# Patient Record
Sex: Female | Born: 1950 | Race: Black or African American | Hispanic: No | Marital: Married | Smoking: Current every day smoker
Health system: Southern US, Community
[De-identification: ages and names within clinical notes are randomized; demographics above are authoritative.]

## PROBLEM LIST (undated history)

## (undated) DIAGNOSIS — E789 Disorder of lipoprotein metabolism, unspecified: Secondary | ICD-10-CM

## (undated) DIAGNOSIS — K047 Periapical abscess without sinus: Secondary | ICD-10-CM

## (undated) HISTORY — PX: MULTIPLE TOOTH EXTRACTIONS: SHX2053

---

## 2019-07-16 ENCOUNTER — Encounter (HOSPITAL_BASED_OUTPATIENT_CLINIC_OR_DEPARTMENT_OTHER): Payer: Self-pay | Admitting: Emergency Medicine

## 2019-07-16 ENCOUNTER — Emergency Department (HOSPITAL_BASED_OUTPATIENT_CLINIC_OR_DEPARTMENT_OTHER)
Admission: EM | Admit: 2019-07-16 | Discharge: 2019-07-16 | Disposition: A | Discharge status: Home / Self Care | Payer: No Typology Code available for payment source | Attending: Emergency Medicine | Admitting: Emergency Medicine

## 2019-07-16 ENCOUNTER — Emergency Department (HOSPITAL_BASED_OUTPATIENT_CLINIC_OR_DEPARTMENT_OTHER): Payer: No Typology Code available for payment source

## 2019-07-16 ENCOUNTER — Other Ambulatory Visit: Payer: Self-pay

## 2019-07-16 DIAGNOSIS — F17200 Nicotine dependence, unspecified, uncomplicated: Secondary | ICD-10-CM | POA: Diagnosis not present

## 2019-07-16 DIAGNOSIS — Z20822 Contact with and (suspected) exposure to covid-19: Secondary | ICD-10-CM | POA: Insufficient documentation

## 2019-07-16 DIAGNOSIS — N3001 Acute cystitis with hematuria: Secondary | ICD-10-CM | POA: Diagnosis not present

## 2019-07-16 DIAGNOSIS — R509 Fever, unspecified: Secondary | ICD-10-CM

## 2019-07-16 LAB — CBC WITH DIFFERENTIAL/PLATELET
Abs Immature Granulocytes: 0.09 10*3/uL — ABNORMAL HIGH (ref 0.00–0.07)
Basophils Absolute: 0 10*3/uL (ref 0.0–0.1)
Basophils Relative: 0 %
Eosinophils Absolute: 0.1 10*3/uL (ref 0.0–0.5)
Eosinophils Relative: 1 %
HCT: 37.5 % (ref 36.0–46.0)
Hemoglobin: 12.4 g/dL (ref 12.0–15.0)
Immature Granulocytes: 1 %
Lymphocytes Relative: 12 %
Lymphs Abs: 2 10*3/uL (ref 0.7–4.0)
MCH: 30 pg (ref 26.0–34.0)
MCHC: 33.1 g/dL (ref 30.0–36.0)
MCV: 90.8 fL (ref 80.0–100.0)
Monocytes Absolute: 1.4 10*3/uL — ABNORMAL HIGH (ref 0.1–1.0)
Monocytes Relative: 8 %
Neutro Abs: 13 10*3/uL — ABNORMAL HIGH (ref 1.7–7.7)
Neutrophils Relative %: 78 %
Platelets: 353 10*3/uL (ref 150–400)
RBC: 4.13 MIL/uL (ref 3.87–5.11)
RDW: 12.8 % (ref 11.5–15.5)
WBC: 16.6 10*3/uL — ABNORMAL HIGH (ref 4.0–10.5)
nRBC: 0 % (ref 0.0–0.2)

## 2019-07-16 LAB — URINALYSIS, ROUTINE W REFLEX MICROSCOPIC
Bilirubin Urine: NEGATIVE
Glucose, UA: NEGATIVE mg/dL
Ketones, ur: 15 mg/dL — AB
Leukocytes,Ua: NEGATIVE
Nitrite: NEGATIVE
Protein, ur: 100 mg/dL — AB
Specific Gravity, Urine: >1.03 — ABNORMAL HIGH (ref 1.005–1.030)
pH: 6 (ref 5.0–8.0)

## 2019-07-16 LAB — COMPREHENSIVE METABOLIC PANEL
ALT: 19 U/L (ref 0–44)
AST: 18 U/L (ref 15–41)
Albumin: 3.5 g/dL (ref 3.5–5.0)
Alkaline Phosphatase: 22 U/L — ABNORMAL LOW (ref 38–126)
Anion gap: 7 (ref 5–15)
BUN: 18 mg/dL (ref 8–23)
CO2: 25 mmol/L (ref 22–32)
Calcium: 9.1 mg/dL (ref 8.9–10.3)
Chloride: 105 mmol/L (ref 98–111)
Creatinine, Ser: 0.68 mg/dL (ref 0.44–1.00)
GFR calc Af Amer: >60 mL/min (ref >60)
GFR calc non Af Amer: >60 mL/min (ref >60)
Glucose, Bld: 146 mg/dL — ABNORMAL HIGH (ref 70–99)
Potassium: 3.3 mmol/L — ABNORMAL LOW (ref 3.5–5.1)
Sodium: 137 mmol/L (ref 135–145)
Total Bilirubin: 0.8 mg/dL (ref 0.3–1.2)
Total Protein: 7.9 g/dL (ref 6.5–8.1)

## 2019-07-16 LAB — LACTIC ACID, PLASMA: Lactic Acid, Venous: 1.2 mmol/L (ref 0.5–1.9)

## 2019-07-16 LAB — URINALYSIS, MICROSCOPIC (REFLEX)

## 2019-07-16 LAB — SARS CORONAVIRUS 2 BY RT PCR (HOSPITAL ORDER, PERFORMED IN ~~LOC~~ HOSPITAL LAB): SARS Coronavirus 2: NEGATIVE

## 2019-07-16 MED ORDER — CEPHALEXIN 250 MG PO CAPS
1000.0000 mg | ORAL_CAPSULE | Freq: Once | ORAL | Status: AC
  Administered 2019-07-16: 16:00:00 1000 mg via ORAL
  Filled 2019-07-16: qty 4

## 2019-07-16 MED ORDER — ACETAMINOPHEN 500 MG PO TABS
1000.0000 mg | ORAL_TABLET | Freq: Once | ORAL | Status: AC
  Administered 2019-07-16: 13:00:00 1000 mg via ORAL
  Filled 2019-07-16: qty 2

## 2019-07-16 MED ORDER — CEPHALEXIN 500 MG PO CAPS: 1000.0000 mg | ORAL_CAPSULE | Freq: Two times a day (BID) | ORAL | 0 refills | Status: AC

## 2019-07-16 NOTE — Discharge Instructions (Signed)
1.  Your Covid test should be available for review in your "MyChart" within approximately 24 hours.  Follow instructions in your discharge instructions regarding actions to take while awaiting your results. 2.  It appears you have a urinary tract infection.  You have been started on Keflex.  Your first dose was given in the emergency department.  If you are able to fill your prescription today, take a dose late tonight around midnight.  Then, you may take it twice daily once in the morning and once in the evening. 3.  A urine culture should be available for review within 24 to 48 hours. 4.  Return to the emergency department if you are feeling more ill, develop pain, vomiting or other concerning symptoms. 5.  Continue to take Tylenol as per package instructions for control of fever and body aches.

## 2019-07-16 NOTE — ED Notes (Signed)
ED Provider at bedside. 

## 2019-07-16 NOTE — ED Notes (Signed)
Candice [phone] daughter for updates

## 2019-07-16 NOTE — ED Provider Notes (Signed)
[facility] Provider Note   CSN: [id number] Arrival date & time: 07/16/19  1218     History Chief Complaint  Patient presents with  . Fever    Patricia Graham is a 69 y.o. female.  HPI   69 year old female with fever and body aches.  Onset about a week ago.  Persistent since then.  She felt like symptoms when improved following came in today with a have not.  Symptoms have waxed and waned.  She had some diarrhea initially which has since resolved.  Occasional nonproductive cough.  No shortness of breath.  No abdominal pain.  No urinary complaints.  No vomiting.  No sick contacts that she is aware of.  History reviewed. No pertinent past medical history.  There are no problems to display for this patient.   History reviewed. No pertinent surgical history.   OB History   No obstetric history on file.     No family history on file.  Social History   Tobacco Use  . Smoking status: Current Every Day Smoker  . Smokeless tobacco: Never Used  Substance Use Topics  . Alcohol use: Not Currently  . Drug use: Not on file    Home Medications Prior to Admission medications   Not on File    Allergies    Iodine  Review of Systems   Review of Systems All systems reviewed and negative, other than as noted in HPI.  Physical Exam Updated Vital Signs BP 110/66   Pulse 83   Temp (!) 101.3 F (38.5 C) (Oral)   Resp 18   Ht 5\' 5"  (1.651 m)   Wt 74.8 kg   SpO2 97% Comment: rm air  BMI 27.46 kg/m   Physical Exam Vitals and nursing note reviewed.  Constitutional:      General: She is not in acute distress.    Appearance: She is well-developed.  HENT:     Head: Normocephalic and atraumatic.  Eyes:     General:        Right eye: No discharge.        Left eye: No discharge.     Conjunctiva/sclera: Conjunctivae normal.  Cardiovascular:     Rate and Rhythm: Normal rate and regular rhythm.     Heart sounds: Normal heart sounds. No  murmur. No friction rub. No gallop.   Pulmonary:     Effort: Pulmonary effort is normal. No respiratory distress.     Breath sounds: Normal breath sounds.  Abdominal:     General: There is no distension.     Palpations: Abdomen is soft.     Tenderness: There is no abdominal tenderness.  Musculoskeletal:        General: No tenderness.     Cervical back: Neck supple.  Skin:    General: Skin is warm and dry.  Neurological:     Mental Status: She is alert.  Psychiatric:        Behavior: Behavior normal.        Thought Content: Thought content normal.     ED Results / Procedures / Treatments   Labs (all labs ordered are listed, but only abnormal results are displayed) Labs Reviewed  URINE CULTURE - Abnormal; Notable for the following components:      Result Value   Culture MULTIPLE SPECIES PRESENT, SUGGEST RECOLLECTION (*)    All other components within normal limits  CBC WITH DIFFERENTIAL/PLATELET - Abnormal; Notable for the following components:   WBC 16.6 (*)  Neutro Abs 13.0 (*)    Monocytes Absolute 1.4 (*)    Abs Immature Granulocytes 0.09 (*)    All other components within normal limits  COMPREHENSIVE METABOLIC PANEL - Abnormal; Notable for the following components:   Potassium 3.3 (*)    Glucose, Bld 146 (*)    Alkaline Phosphatase 22 (*)    All other components within normal limits  URINALYSIS, ROUTINE W REFLEX MICROSCOPIC - Abnormal; Notable for the following components:   APPearance CLOUDY (*)    Specific Gravity, Urine >1.030 (*)    Hgb urine dipstick LARGE (*)    Ketones, ur 15 (*)    Protein, ur 100 (*)    All other components within normal limits  URINALYSIS, MICROSCOPIC (REFLEX) - Abnormal; Notable for the following components:   Bacteria, UA MANY (*)    All other components within normal limits  CULTURE, BLOOD (ROUTINE X 2)  CULTURE, BLOOD (ROUTINE X 2)  SARS CORONAVIRUS 2 (HOSPITAL ORDER, PERFORMED IN [facility])  LACTIC ACID, PLASMA     EKG None  Radiology DG Chest Portable 1 View  Result Date: 07/16/2019 CLINICAL DATA:  Fever and chills for 1 week. EXAM: PORTABLE CHEST 1 VIEW COMPARISON:  None. FINDINGS: The heart size and mediastinal contours are within normal limits. Both lungs are clear. The visualized skeletal structures are unremarkable. IMPRESSION: No active disease. Electronically Signed   By: Sherian Rein M.D.   On: 07/16/2019 13:57    Procedures Procedures (including critical care time)  Medications Ordered in ED Medications  acetaminophen (TYLENOL) tablet 1,000 mg (1,000 mg Oral Given 07/16/19 1319)    ED Course  I have reviewed the triage vital signs and the nursing notes.  Pertinent labs & imaging results that were available during my care of the patient were reviewed by me and considered in my medical decision making (see chart for details).    MDM Rules/Calculators/A&P                      69 year old female with fever.  Multiple somewhat vague and nondescript symptoms.  She appears quite well.  She has a fever but vital signs are normal.  She sounds clear on exam.  Oxygen saturations are normal on room air.  She has no increased work of breathing.  She is not hypotensive.  Abdominal exam is benign.  Work-up thus far fairly unremarkable aside from leukocytosis.  Chest x-ray without acute abnormality.  Covid testing is negative.  UA is pending although she has no specific urinary complaints.  Care was signed out to Dr. Clarice Pole with urinalysis pending.  Blood cultures sent.  I feel patient is is appropriate for discharge regardless of these results.  Possible antibiotics at the discretion of Dr. Clarice Pole. Final Clinical Impression(s) / ED Diagnoses Final diagnoses:  Febrile illness    Rx / DC Orders ED Discharge Orders    None       Raeford Razor, MD 07/19/19 1243

## 2019-07-16 NOTE — ED Triage Notes (Signed)
Fever, chills, body aches x 7 days.

## 2019-07-16 NOTE — ED Provider Notes (Signed)
Patient accepted at signout for review of urinalysis.  Patient has had about a week's worth of fevers and myalgias.  She has been worried about Covid.  Occasional mild cough but no shortness of breath or chest pain. Physical Exam  BP 118/70 (BP Location: Left Arm)   Pulse 96   Temp 99.2 F (37.3 C) (Oral)   Resp 16   Ht 5\' 5"  (1.651 m)   Wt 74.8 kg   SpO2 98%   BMI 27.46 kg/m   Physical Exam Patient is alert and nontoxic.  Clinically well in appearance sitting at the edge of stretcher.  Well-nourished and well-developed.  No respiratory distress. ED Course/Procedures     Procedures  MDM  Urinalysis is positive for blood and white cells.  Specimen does not appear to be contaminated.  Will proceed with culture and empiric treatment with Keflex due to history of fever and myalgia.  Return precautions reviewed.       , MD 07/16/19 [phone]

## 2019-07-17 LAB — URINE CULTURE

## 2019-07-21 LAB — CULTURE, BLOOD (ROUTINE X 2)
Culture: NO GROWTH
Culture: NO GROWTH
Special Requests: ADEQUATE

## 2020-11-04 ENCOUNTER — Encounter (HOSPITAL_BASED_OUTPATIENT_CLINIC_OR_DEPARTMENT_OTHER): Payer: Self-pay

## 2020-11-04 ENCOUNTER — Emergency Department (HOSPITAL_BASED_OUTPATIENT_CLINIC_OR_DEPARTMENT_OTHER)
Admission: EM | Admit: 2020-11-04 | Discharge: 2020-11-04 | Disposition: A | Discharge status: Home / Self Care | Payer: No Typology Code available for payment source | Attending: Emergency Medicine | Admitting: Emergency Medicine

## 2020-11-04 ENCOUNTER — Other Ambulatory Visit: Payer: Self-pay

## 2020-11-04 ENCOUNTER — Emergency Department (HOSPITAL_BASED_OUTPATIENT_CLINIC_OR_DEPARTMENT_OTHER): Payer: No Typology Code available for payment source

## 2020-11-04 DIAGNOSIS — F17210 Nicotine dependence, cigarettes, uncomplicated: Secondary | ICD-10-CM | POA: Insufficient documentation

## 2020-11-04 DIAGNOSIS — R5383 Other fatigue: Secondary | ICD-10-CM

## 2020-11-04 DIAGNOSIS — Z20822 Contact with and (suspected) exposure to covid-19: Secondary | ICD-10-CM | POA: Insufficient documentation

## 2020-11-04 DIAGNOSIS — R110 Nausea: Secondary | ICD-10-CM | POA: Insufficient documentation

## 2020-11-04 DIAGNOSIS — R519 Headache, unspecified: Secondary | ICD-10-CM | POA: Diagnosis not present

## 2020-11-04 LAB — URINALYSIS, ROUTINE W REFLEX MICROSCOPIC
Bilirubin Urine: NEGATIVE
Glucose, UA: NEGATIVE mg/dL
Ketones, ur: NEGATIVE mg/dL
Leukocytes,Ua: NEGATIVE
Nitrite: NEGATIVE
Protein, ur: NEGATIVE mg/dL
Specific Gravity, Urine: 1.01 (ref 1.005–1.030)
pH: 7 (ref 5.0–8.0)

## 2020-11-04 LAB — URINALYSIS, MICROSCOPIC (REFLEX)

## 2020-11-04 LAB — RESP PANEL BY RT-PCR (FLU A&B, COVID) ARPGX2
Influenza A by PCR: NEGATIVE
Influenza B by PCR: NEGATIVE
SARS Coronavirus 2 by RT PCR: NEGATIVE

## 2020-11-04 MED ORDER — KETOROLAC TROMETHAMINE 60 MG/2ML IM SOLN
60.0000 mg | Freq: Once | INTRAMUSCULAR | Status: AC
  Administered 2020-11-04: 60 mg via INTRAMUSCULAR
  Filled 2020-11-04: qty 2

## 2020-11-04 NOTE — ED Notes (Signed)
Ambulated to bathroom with steady gait

## 2020-11-04 NOTE — ED Notes (Signed)
Unsteady gait with the headache, it has cleared up since she been here.

## 2020-11-04 NOTE — ED Notes (Signed)
Pt discharged to home. Discharge instructions have been discussed with patient and/or family members. Pt verbally acknowledges understanding d/c instructions, and endorses comprehension to checkout at registration before leaving.  °

## 2020-11-04 NOTE — ED Provider Notes (Signed)
[facility] EM[facility]GENCY DEPARTMENT Provider Note   CSN: [id number] Arrival date & time: 11/04/20  3570     History Chief Complaint  Patient presents with  . Headache    Kebrina Friend is a 70 y.o. female.  HPI      70 year old female with remote history of migraine presents with concern for headache.  Reports that she woke out of sleep at 4 AM with a severe, 10 out of 10 throbbing headache.  It has now decreased to an 8 out of 10.  Describes associated nausea, and try to get herself to vomit as it made her feel better with the migraine she had more than 35 years ago, but she was not able to vomit.  Reports a feeling of generalized weakness, and feels "cold."  She feels somewhat lightheaded.  Denies numbness, weakness, change in vision, difficulty talking.  Reports that she does feel some difficulty walking given her lightheadedness, but she did drive herself here.  Denies cough, congestion, sore throat, fever, or other concerns.  Reports that she is hesitant to come to the hospital, and this reflects the severity of her symptoms today.  No family history of aneurysms. Aspirin made her symptoms worse.   History reviewed. No pertinent past medical history.  There are no problems to display for this patient.   History reviewed. No pertinent surgical history.   OB History   No obstetric history on file.     History reviewed. No pertinent family history.  Social History   Tobacco Use  . Smoking status: Current Every Day Smoker    Types: Cigarettes  . Smokeless tobacco: Never Used  Substance Use Topics  . Alcohol use: Not Currently    Home Medications Prior to Admission medications   Medication Sig Start Date End Date Taking? Authorizing Provider  cephALEXin (KEFLEX) 500 MG capsule Take 2 capsules (1,000 mg total) by mouth 2 (two) times daily. 07/16/19   Arby Barrette, MD    Allergies    Iodine  Review of Systems   Review of Systems  Constitutional:  Negative for fever.  HENT: Negative for sore throat.   Eyes: Negative for visual disturbance.  Respiratory: Negative for cough and shortness of breath.   Cardiovascular: Negative for chest pain.  Gastrointestinal: Positive for nausea. Negative for abdominal pain, diarrhea and vomiting.  Genitourinary: Negative for difficulty urinating.  Musculoskeletal: Negative for back pain and neck pain.  Skin: Negative for rash.  Neurological: Positive for dizziness, light-headedness and headaches. Negative for seizures, syncope, facial asymmetry, speech difficulty, weakness and numbness.    Physical Exam Updated Vital Signs BP (!) 154/86 (BP Location: Right Arm)   Pulse 61   Temp 98 F (36.7 C) (Oral)   Resp 20   Ht 5' 5.5" (1.664 m)   Wt 74.8 kg   SpO2 100%   BMI 27.04 kg/m   Physical Exam Vitals and nursing note reviewed.  Constitutional:      General: She is not in acute distress.    Appearance: Normal appearance. She is well-developed. She is not ill-appearing or diaphoretic.  HENT:     Head: Normocephalic and atraumatic.  Eyes:     General: No visual field deficit.    Extraocular Movements: Extraocular movements intact.     Conjunctiva/sclera: Conjunctivae normal.     Pupils: Pupils are equal, round, and reactive to light.  Cardiovascular:     Rate and Rhythm: Normal rate and regular rhythm.     Pulses: Normal pulses.  Heart sounds: Normal heart sounds. No murmur heard. No friction rub. No gallop.   Pulmonary:     Effort: Pulmonary effort is normal. No respiratory distress.     Breath sounds: Normal breath sounds. No wheezing or rales.  Abdominal:     General: There is no distension.     Palpations: Abdomen is soft.     Tenderness: There is no abdominal tenderness. There is no guarding.  Musculoskeletal:        General: No swelling or tenderness.     Cervical back: Normal range of motion.  Skin:    General: Skin is warm and dry.     Findings: No erythema or rash.   Neurological:     General: No focal deficit present.     Mental Status: She is alert and oriented to person, place, and time.     GCS: GCS eye subscore is 4. GCS verbal subscore is 5. GCS motor subscore is 6.     Cranial Nerves: No cranial nerve deficit, dysarthria or facial asymmetry.     Sensory: No sensory deficit.     Motor: No weakness or tremor.     Coordination: Coordination normal. Finger-Nose-Finger Test normal.     Gait: Gait normal.     ED Results / Procedures / Treatments   Labs (all labs ordered are listed, but only abnormal results are displayed) Labs Reviewed  RESP PANEL BY RT-PCR (FLU A&B, COVID) [id number]  URINE CULTURE  URINALYSIS, ROUTINE W REFLEX MICROSCOPIC    EKG None  Radiology CT Head Wo Contrast  Result Date: 11/04/2020 CLINICAL DATA:  70 year old female with headache since last night. EXAM: CT HEAD WITHOUT CONTRAST TECHNIQUE: Contiguous axial images were obtained from the base of the skull through the vertex without intravenous contrast. COMPARISON:  None. FINDINGS: Brain: Cerebral volume is within normal limits for age. No midline shift, ventriculomegaly, mass effect, evidence of mass lesion, intracranial hemorrhage or evidence of cortically based acute infarction. Gray-white matter differentiation is within normal limits throughout the brain. Vascular: Mild Calcified atherosclerosis at the skull base. No suspicious intracranial vascular hyperdensity. Skull: Negative. Sinuses/Orbits: Hyperplastic paranasal sinuses are clear. Tympanic cavities and mastoids appear clear. Other: Visualized orbits and scalp soft tissues are within normal limits. IMPRESSION: Normal noncontrast Head CT. Electronically Signed   By: Odessa Fleming M.D.   On: 11/04/2020 09:57    Procedures Procedures   Medications Ordered in ED Medications  ketorolac (TORADOL) injection 60 mg (has no administration in time range)    ED Course  I have reviewed the triage vital signs and the nursing  notes.  Pertinent labs & imaging results that were available during my care of the patient were reviewed by me and considered in my medical decision making (see chart for details).    MDM Rules/Calculators/A&P                          70 year old female with remote history of migraine presents with concern for headache.  Given pt waking with headache, CT ordered for evaluation.  Declines medications for headache at this time as she is driving. No signs to indicate meningitis.  She has anaphylaxis to IV contrast and CT completed within 6 hours of symptoms and without signs of ICH.  Given timing of CT, spontaneous improvement of headache, have low suspicion for occult ICH and do not feel LP indicated at this time. Discussed possibility of labs but she has not had vomiting/black  or bloody stools or other symptoms to suggest abnormalities.  COVID/flu testing negative. Denies CP/dyspnea. Has normal neurologic exam including normal gait and do not suspect CVA.  UA shows no sign of infection. Possible other viral etiology or return of migraine. Recommend PCP follow up.  Headache improved with toradol. Patient discharged in stable condition with understanding of reasons to return.    Final Clinical Impression(s) / ED Diagnoses Final diagnoses:  Acute nonintractable headache, unspecified headache type  Fatigue, unspecified type    Rx / DC Orders ED Discharge Orders    None       Alvira Monday, MD 11/04/20 1128

## 2020-11-04 NOTE — ED Triage Notes (Signed)
Pt reports headache since last night, chills, weakness.  Denies fever, denies congestion.  Denies vision changes.

## 2020-11-05 LAB — URINE CULTURE

## 2022-05-10 IMAGING — CT CT HEAD W/O CM
3 series · 16 of 47 positions shown, 19 images · non-contrast
Comparison: None.

CLINICAL DATA: 70-year-old female with headache since last night.

EXAM:
CT HEAD WITHOUT CONTRAST
TECHNIQUE: Contiguous axial images were obtained from the base of the skull
through the vertex without intravenous contrast.

[Series 2: head wo · axial · 0.43mm/px · z∈[+710,+850]mm · 10 of 34 slices shown, 13 images]
[im 3/34  brain]
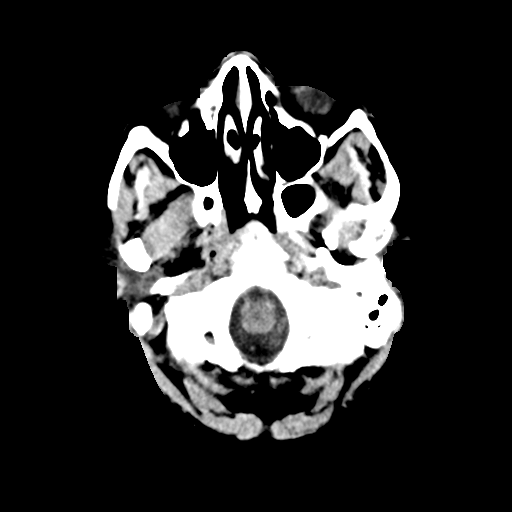
[im 3/34  bone]
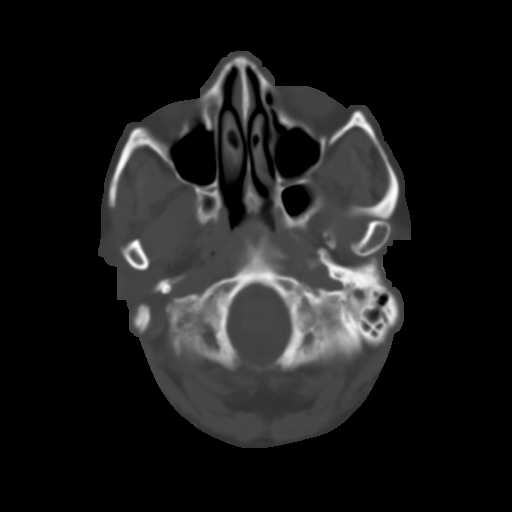
[im 6/34  brain]
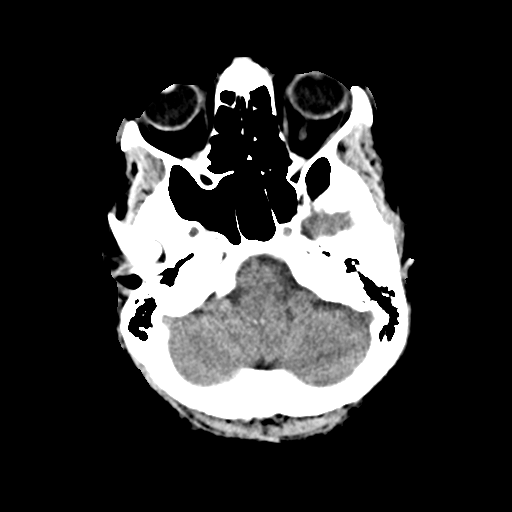
[im 10/34  brain]
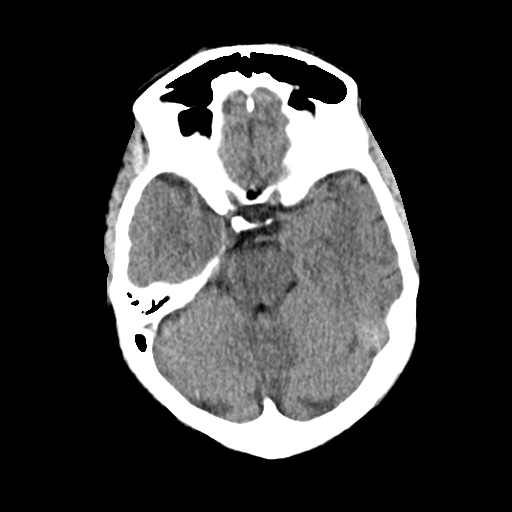
[im 12/34  brain]
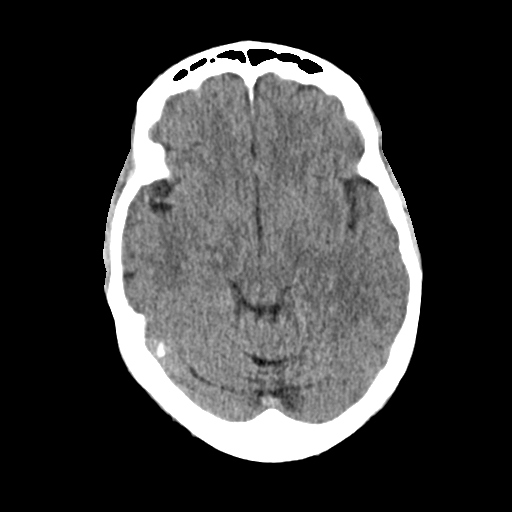
[im 15/34  brain]
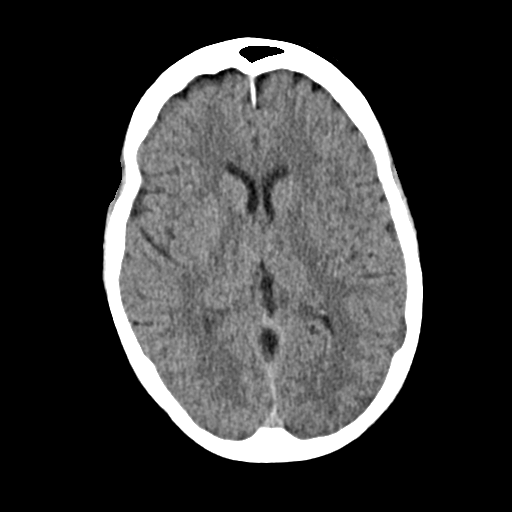
[im 15/34  bone]
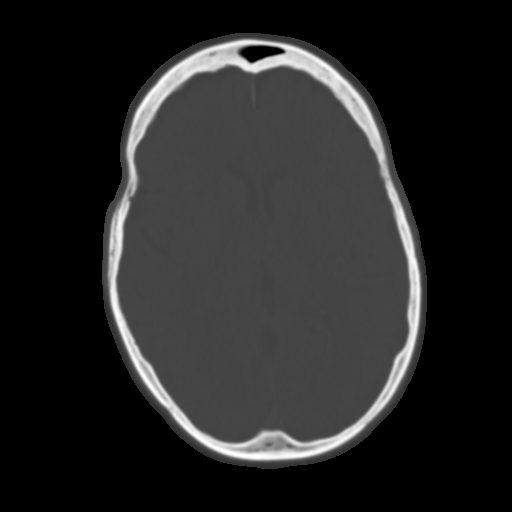
[im 19/34  brain]
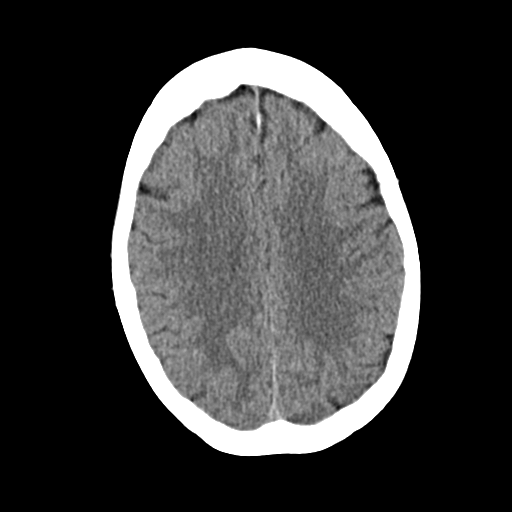
[im 22/34  brain]
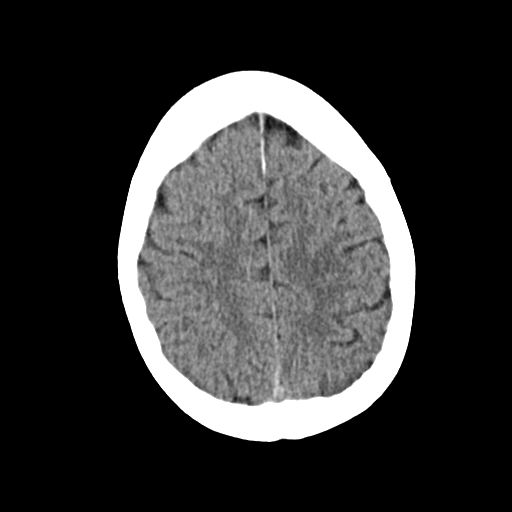
[im 26/34  brain]
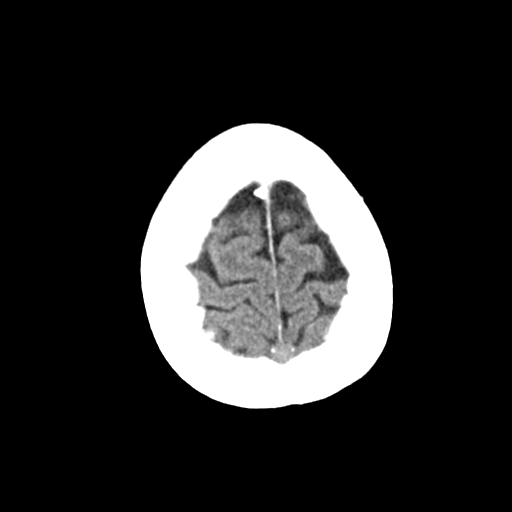
[im 28/34  brain]
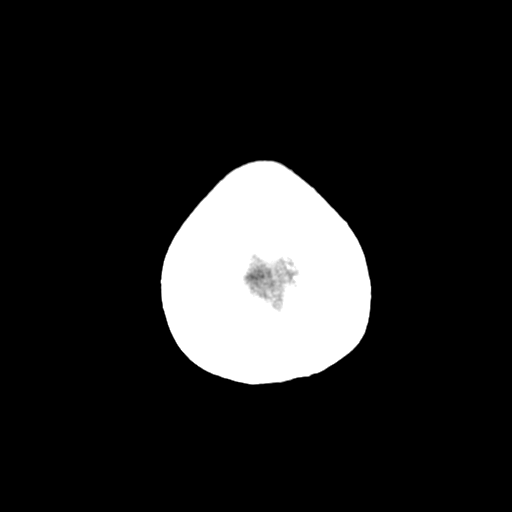
[im 28/34  bone]
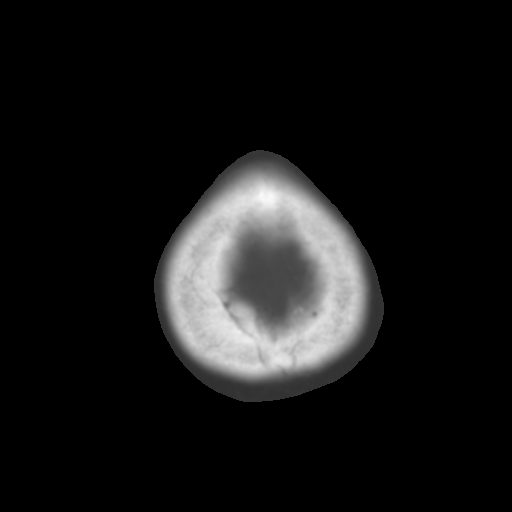
[im 31/34  brain]
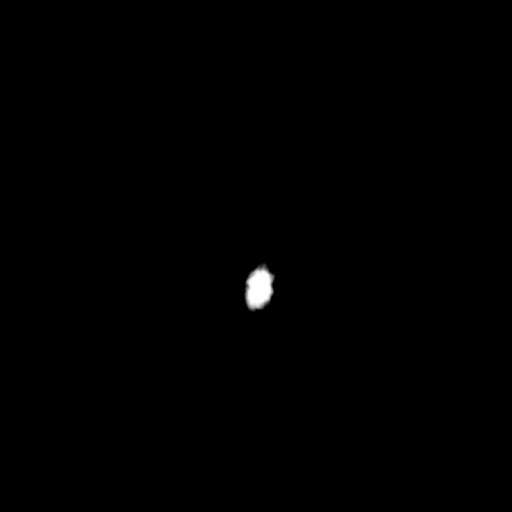

[Series 4: coronal soft · coronal · 0.33mm/px · 3 of 69 slices shown]
[im 23/69  brain]
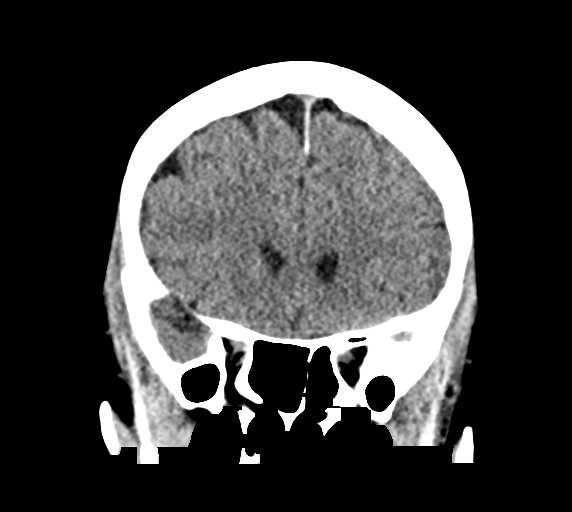
[im 31/69  brain]
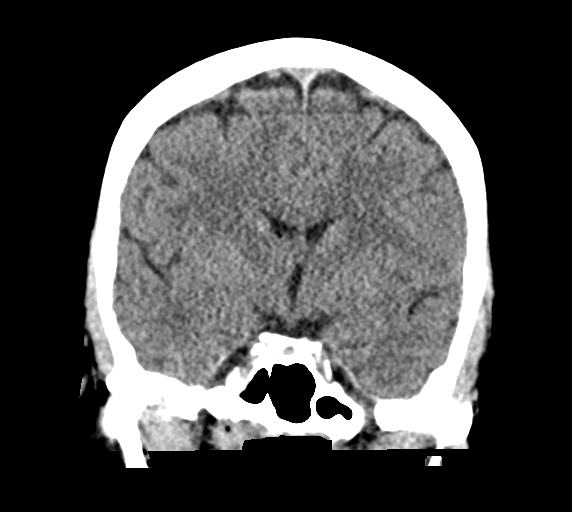
[im 38/69  brain]
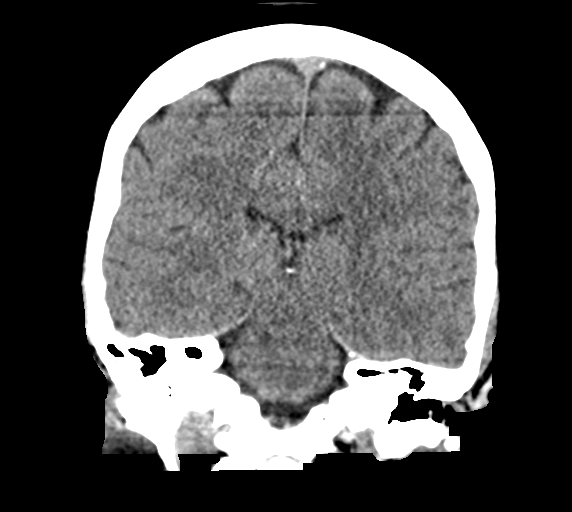

[Series 5: sag soft · sagittal · 0.33mm/px · 3 of 53 slices shown]
[im 18/53  brain]
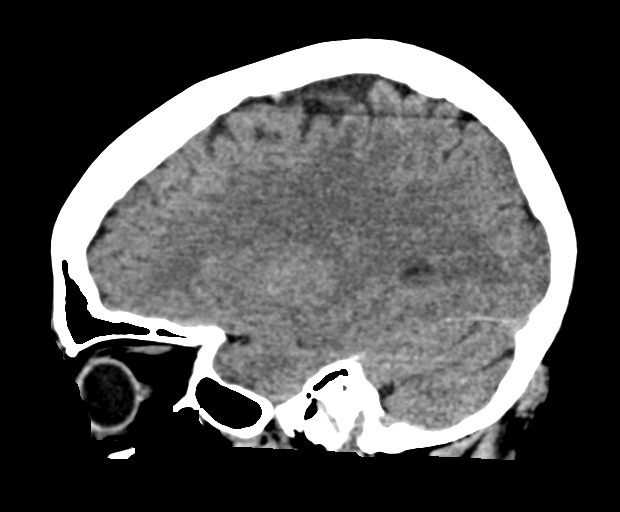
[im 27/53  brain]
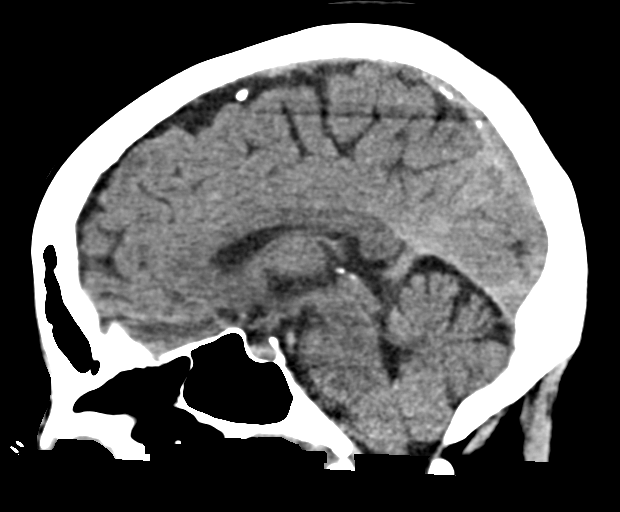
[im 35/53  brain]
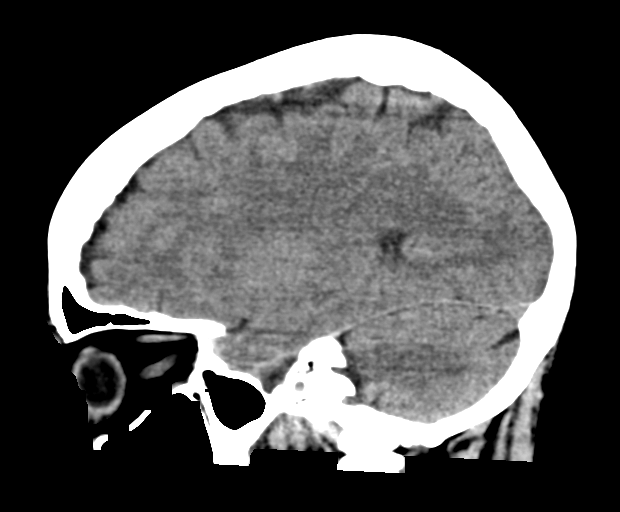

[16 of 47 positions shown; findings below may reference images not displayed]

FINDINGS: Brain: Cerebral volume is within normal limits for age. No midline
shift, ventriculomegaly, mass effect, evidence of mass lesion,
intracranial hemorrhage or evidence of cortically based acute
infarction. Gray-white matter differentiation is within normal
limits throughout the brain.

Vascular: Mild Calcified atherosclerosis at the skull base. No
suspicious intracranial vascular hyperdensity.

Skull: Negative.

Sinuses/Orbits: Hyperplastic paranasal sinuses are clear. Tympanic
cavities and mastoids appear clear.

Other: Visualized orbits and scalp soft tissues are within normal
limits.
IMPRESSION: Normal noncontrast Head CT.

## 2022-10-15 ENCOUNTER — Other Ambulatory Visit: Payer: Self-pay

## 2022-10-15 ENCOUNTER — Emergency Department (HOSPITAL_BASED_OUTPATIENT_CLINIC_OR_DEPARTMENT_OTHER)
Admission: EM | Admit: 2022-10-15 | Discharge: 2022-10-15 | Disposition: A | Discharge status: Home / Self Care | Payer: No Typology Code available for payment source | Attending: Emergency Medicine | Admitting: Emergency Medicine

## 2022-10-15 ENCOUNTER — Encounter (HOSPITAL_BASED_OUTPATIENT_CLINIC_OR_DEPARTMENT_OTHER): Payer: Self-pay | Admitting: Emergency Medicine

## 2022-10-15 DIAGNOSIS — K047 Periapical abscess without sinus: Secondary | ICD-10-CM

## 2022-10-15 DIAGNOSIS — R590 Localized enlarged lymph nodes: Secondary | ICD-10-CM | POA: Insufficient documentation

## 2022-10-15 DIAGNOSIS — K0889 Other specified disorders of teeth and supporting structures: Secondary | ICD-10-CM | POA: Diagnosis present

## 2022-10-15 HISTORY — DX: Disorder of lipoprotein metabolism, unspecified: E78.9

## 2022-10-15 HISTORY — DX: Periapical abscess without sinus: K04.7

## 2022-10-15 MED ORDER — AMOXICILLIN 500 MG PO CAPS: 500.0000 mg | ORAL_CAPSULE | Freq: Three times a day (TID) | ORAL | 0 refills | Status: AC

## 2022-10-15 MED ORDER — AMOXICILLIN 500 MG PO CAPS
500.0000 mg | ORAL_CAPSULE | Freq: Once | ORAL | Status: AC
  Administered 2022-10-15: 500 mg via ORAL
  Filled 2022-10-15: qty 1

## 2022-10-15 NOTE — ED Triage Notes (Signed)
Pt reports waking up this morning with dental pain on the left upper back molar, there is no tooth present, it was pulled several years ago, reports pain radiates down to her throat, site is edematous, has been swishing peroxide & salt water all day, cannot remember last dental appt

## 2022-10-15 NOTE — ED Provider Notes (Signed)
[facility] Provider Note   CSN: [id number] Arrival date & time: 10/15/22  2032     History  Chief Complaint  Patient presents with   Dental Pain    Patricia Graham is a 72 y.o. female.  Patient presents the emergency department complaining of left upper dental pain.  Patient states she had a tooth pulled in the area many years ago and has inflammation in the right this time.  She denies difficulty swallowing, voice changes, fevers, nausea, vomiting.  She denies any recent dental care.  Past medical history significant for multiple tooth extractions, dental abscess  HPI     Home Medications Prior to Admission medications   Medication Sig Start Date End Date Taking? Authorizing Provider  amoxicillin (AMOXIL) 500 MG capsule Take 1 capsule (500 mg total) by mouth 3 (three) times daily. 10/15/22  Yes Darrick Grinder, PA-C  cephALEXin (KEFLEX) 500 MG capsule Take 2 capsules (1,000 mg total) by mouth 2 (two) times daily. 07/16/19   Arby Barrette, MD      Allergies    Iodine    Review of Systems   Review of Systems  Physical Exam Updated Vital Signs BP 130/70 (BP Location: Right Arm)   Pulse 72   Temp 98 F (36.7 C)   Resp 20   Ht 5\' 6"  (1.676 m)   Wt 77.1 kg   SpO2 98%   BMI 27.44 kg/m  Physical Exam Vitals and nursing note reviewed.  Constitutional:      General: She is not in acute distress.    Appearance: She is well-developed.  HENT:     Head: Normocephalic and atraumatic.     Mouth/Throat:   Eyes:     Conjunctiva/sclera: Conjunctivae normal.  Cardiovascular:     Rate and Rhythm: Normal rate.     Heart sounds: No murmur heard. Pulmonary:     Effort: Pulmonary effort is normal.  Musculoskeletal:        General: No swelling.     Cervical back: Neck supple.  Lymphadenopathy:     Cervical: Cervical adenopathy (Palpable left-sided anterior cervical lymph nodes) present.  Skin:    General: Skin is warm and  dry.     Capillary Refill: Capillary refill takes less than 2 seconds.  Neurological:     Mental Status: She is alert.  Psychiatric:        Mood and Affect: Mood normal.     ED Results / Procedures / Treatments   Labs (all labs ordered are listed, but only abnormal results are displayed) Labs Reviewed - No data to display  EKG None  Radiology No results found.  Procedures Procedures    Medications Ordered in ED Medications  amoxicillin (AMOXIL) capsule 500 mg (has no administration in time range)    ED Course/ Medical Decision Making/ A&P                             Medical Decision Making Risk Prescription drug management.   Patient presents with a chief complaint of dental pain.  Differential diagnosis includes was not limited to dental infection, abscess, peritonsillar abscess, Ludwig angina, others  The patient has comorbidities including previous dental abscess.  The patient has an area that appears to be inflamed in the upper left molar region.  There is no tooth in that area but it does appear inflamed in the gingival region.  There is no  drainable abscess in that area.  Patient with palpable cervical lymph node but no tonsillar swelling, no tonsillar exudate, no signs of peritonsillar abscess or Ludwig angina.  Patient is talking in a normal voice.  Patient has no difficulty with swallowing at this time.  Patient administered 1 dose of amoxicillin here with plans to discharge with prescription for the same.  I did consider potentially doing a CT neck soft tissue with contrast but there are no clinical signs at this time of PTA.  Patient given strict return precautions.  Patient with overall poor dentition.  Plan to have patient follow-up with dentistry for further evaluation and management.        Final Clinical Impression(s) / ED Diagnoses Final diagnoses:  Pain, dental  Dental infection    Rx / DC Orders ED Discharge Orders          Ordered     amoxicillin (AMOXIL) 500 MG capsule  3 times daily        10/15/22 2208              Pamala Duffel 10/15/22 2208    Alvira Monday, MD 10/16/22 [phone]

## 2022-10-15 NOTE — Discharge Instructions (Addendum)
You were evaluated today for dental pain.  It appears to be an infection developing in the left upper molar region.  I have prescribed antibiotics.  If your symptoms worsen or you begin to have difficulty swallowing, develop other life-threatening symptoms, please return to the emergency department.  Otherwise, please follow-up with dentistry for further evaluation and management.

## 2023-06-24 ENCOUNTER — Encounter (HOSPITAL_BASED_OUTPATIENT_CLINIC_OR_DEPARTMENT_OTHER): Payer: Self-pay

## 2023-06-24 ENCOUNTER — Emergency Department (HOSPITAL_BASED_OUTPATIENT_CLINIC_OR_DEPARTMENT_OTHER)
Admission: EM | Admit: 2023-06-24 | Discharge: 2023-06-24 | Disposition: A | Discharge status: Home / Self Care | Payer: No Typology Code available for payment source | Attending: Emergency Medicine | Admitting: Emergency Medicine

## 2023-06-24 ENCOUNTER — Other Ambulatory Visit: Payer: Self-pay

## 2023-06-24 DIAGNOSIS — K047 Periapical abscess without sinus: Secondary | ICD-10-CM | POA: Diagnosis not present

## 2023-06-24 DIAGNOSIS — R590 Localized enlarged lymph nodes: Secondary | ICD-10-CM | POA: Insufficient documentation

## 2023-06-24 LAB — BASIC METABOLIC PANEL
Anion gap: 7 (ref 5–15)
BUN: 17 mg/dL (ref 8–23)
CO2: 26 mmol/L (ref 22–32)
Calcium: 9.2 mg/dL (ref 8.9–10.3)
Chloride: 105 mmol/L (ref 98–111)
Creatinine, Ser: 0.77 mg/dL (ref 0.44–1.00)
GFR, Estimated: >60 mL/min (ref >60)
Glucose, Bld: 132 mg/dL — ABNORMAL HIGH (ref 70–99)
Potassium: 3.6 mmol/L (ref 3.5–5.1)
Sodium: 138 mmol/L (ref 135–145)

## 2023-06-24 LAB — CBC
HCT: 38.5 % (ref 36.0–46.0)
Hemoglobin: 12.8 g/dL (ref 12.0–15.0)
MCH: 30.3 pg (ref 26.0–34.0)
MCHC: 33.2 g/dL (ref 30.0–36.0)
MCV: 91 fL (ref 80.0–100.0)
Platelets: 315 10*3/uL (ref 150–400)
RBC: 4.23 MIL/uL (ref 3.87–5.11)
RDW: 13.4 % (ref 11.5–15.5)
WBC: 10.5 10*3/uL (ref 4.0–10.5)
nRBC: 0 % (ref 0.0–0.2)

## 2023-06-24 MED ORDER — AMOXICILLIN-POT CLAVULANATE 875-125 MG PO TABS: 1.0000 | ORAL_TABLET | Freq: Two times a day (BID) | ORAL | 0 refills | Status: AC

## 2023-06-24 NOTE — Discharge Instructions (Signed)
Please follow-up with your PCP.  Please utilize dental resource guide attached to find a dentist in the area to follow-up with.  Please begin taking Augmentin twice a day for the next 7 days.  Return to the ED with any new or worsening symptoms.  The swelling (adenopathy) over the right side of your throat should decrease with antibiotics.

## 2023-06-24 NOTE — ED Triage Notes (Signed)
Pt c/o swollen glands in her neck that started about 4 days ago. Pt reports she had an abscessed tooth about a month ago and the [place] doesn't have any openings for dental until July.

## 2023-06-24 NOTE — ED Provider Notes (Signed)
EMERGENCY DEPARTMENT AT [facility] Provider Note   CSN: [id number] Arrival date & time: 06/24/23  1205     History  Chief Complaint  Patient presents with   Lymphadenopathy    Patricia Graham is a 73 y.o. female with medical history of multiple tooth extractions.  The patient presents to the ED for evaluation of right-sided lymphadenopathy.  Reports over the last 3 days she has a progressively worsening right-sided lymphadenopathy.  Reports that today she was unable to fully open her mouth to eat and this concerned her, "if I can eat something is wrong".  She states that she has had lymphadenopathy on the right side before secondary to a tooth infection.  She denies any recent URI symptoms but is endorsing right lower tooth pain that began about a month ago.  She reports she has been trying to get into see a provider for this but has not been unable to do so secondary to having [employer] [facility].  Denies fevers, nausea, vomiting at home.  Denies drooling, change in phonation.  Denies sick contacts.  HPI     Home Medications Prior to Admission medications   Medication Sig Start Date End Date Taking? Authorizing Provider  amoxicillin-clavulanate (AUGMENTIN) 875-125 MG tablet Take 1 tablet by mouth every 12 (twelve) hours. 06/24/23  Yes Al Decant, PA-C  amoxicillin (AMOXIL) 500 MG capsule Take 1 capsule (500 mg total) by mouth 3 (three) times daily. 10/15/22   Darrick Grinder, PA-C  cephALEXin (KEFLEX) 500 MG capsule Take 2 capsules (1,000 mg total) by mouth 2 (two) times daily. 07/16/19   Arby Barrette, MD      Allergies    Iodine    Review of Systems   Review of Systems  Constitutional:  Negative for fever.  HENT:  Positive for dental problem. Negative for facial swelling.        Right-sided lymphadenopathy  Gastrointestinal:  Negative for nausea and vomiting.  All other systems reviewed and are negative.   Physical Exam Updated Vital  Signs BP 121/75   Pulse (!) 111   Temp 98.7 F (37.1 C)   Resp 20   Ht 5\' 6"  (1.676 m)   Wt 74.8 kg   SpO2 98%   BMI 26.63 kg/m  Physical Exam Vitals and nursing note reviewed.  Constitutional:      General: She is not in acute distress.    Appearance: Normal appearance. She is not ill-appearing, toxic-appearing or diaphoretic.  HENT:     Head: Normocephalic and atraumatic.     Nose: Nose normal.     Mouth/Throat:     Mouth: Mucous membranes are moist.     Pharynx: Oropharynx is clear.     Comments: Dental caries throughout.  Patient does have area of what appears to be infection with erythema to her gumline of the right lower portion of her jaw.  No abscess needed drainage.  Uvula midline, handling secretions appropriately. Eyes:     Extraocular Movements: Extraocular movements intact.     Conjunctiva/sclera: Conjunctivae normal.     Pupils: Pupils are equal, round, and reactive to light.  Neck:     Comments: Right-sided cervical adenopathy.  Nontender to touch. Cardiovascular:     Rate and Rhythm: Normal rate and regular rhythm.  Pulmonary:     Effort: Pulmonary effort is normal.     Breath sounds: Normal breath sounds. No wheezing.  Abdominal:     General: Abdomen is flat. Bowel sounds are  normal.     Palpations: Abdomen is soft.     Tenderness: There is no abdominal tenderness.  Musculoskeletal:     Cervical back: Normal range of motion and neck supple. No rigidity or tenderness.  Lymphadenopathy:     Cervical: Cervical adenopathy present.  Skin:    General: Skin is warm and dry.     Capillary Refill: Capillary refill takes less than 2 seconds.  Neurological:     Mental Status: She is alert and oriented to person, place, and time.     ED Results / Procedures / Treatments   Labs (all labs ordered are listed, but only abnormal results are displayed) Labs Reviewed  BASIC METABOLIC PANEL - Abnormal; Notable for the following components:      Result Value    Glucose, Bld 132 (*)    All other components within normal limits  CBC    EKG None  Radiology No results found.  Procedures Procedures   Medications Ordered in ED Medications - No data to display  ED Course/ Medical Decision Making/ A&P  Medical Decision Making  73 year old female presents for evaluation of right-sided adenopathy.  Please see HPI for further details.  On examination patient does have appreciable cervical adenopathy on the right.  She also has what appears to be erythema, possible infection to her gumline over the right lower portion of her jaw.  No abscess needed drainage.  Her uvula is midline, she is handling secretions appropriately, there is no drooling or change phonation.  She is afebrile and nontachycardic.  Overall nontoxic in appearance.  Speaking in full sentences.  Patient reports that this has happened in the past secondary to a tooth infection.  Patient appears to have a tooth infection on clinical examination.  Will suspect that her adenopathy is secondary to tooth infection, reactive adenopathy as she has a history of this.  Will collect labs to ensure no white count elevation.  Labs reassuring.  No leukocytosis or anemia.  Metabolic panel unremarkable.  At this time we will treat patient with course of Augmentin.  Will provide her with dental resources.  Will have her follow-up with dentist.  Advised to return to the ED with any new or worsening symptoms.  Voiced understanding.  Stable to discharge.   Final Clinical Impression(s) / ED Diagnoses Final diagnoses:  Dental infection  Cervical adenopathy    Rx / DC Orders ED Discharge Orders          Ordered    amoxicillin-clavulanate (AUGMENTIN) 875-125 MG tablet  Every 12 hours        06/24/23 1333              Clent Ridges 06/24/23 1334    Ernie Avena, MD 06/24/23 [phone]
